# Patient Record
Sex: Male | Born: 1965 | Race: White | Hispanic: No | Marital: Married
Health system: Southern US, Community
[De-identification: ages and names within clinical notes are randomized; demographics above are authoritative.]

---

## 2018-03-02 ENCOUNTER — Emergency Department (HOSPITAL_COMMUNITY): Payer: Managed Care, Other (non HMO)

## 2018-03-02 ENCOUNTER — Other Ambulatory Visit: Payer: Self-pay

## 2018-03-02 ENCOUNTER — Encounter (HOSPITAL_COMMUNITY): Payer: Self-pay

## 2018-03-02 ENCOUNTER — Emergency Department (HOSPITAL_COMMUNITY)
Admission: EM | Admit: 2018-03-02 | Discharge: 2018-03-02 | Disposition: A | Discharge status: Home / Self Care | Payer: Managed Care, Other (non HMO) | Attending: Emergency Medicine | Admitting: Emergency Medicine

## 2018-03-02 DIAGNOSIS — R1031 Right lower quadrant pain: Secondary | ICD-10-CM | POA: Insufficient documentation

## 2018-03-02 DIAGNOSIS — R319 Hematuria, unspecified: Secondary | ICD-10-CM

## 2018-03-02 DIAGNOSIS — R109 Unspecified abdominal pain: Secondary | ICD-10-CM

## 2018-03-02 LAB — CBC WITH DIFFERENTIAL/PLATELET
Abs Immature Granulocytes: 0.02 10*3/uL (ref 0.00–0.07)
Basophils Absolute: 0 10*3/uL (ref 0.0–0.1)
Basophils Relative: 1 %
Eosinophils Absolute: 0.2 10*3/uL (ref 0.0–0.5)
Eosinophils Relative: 4 %
HEMATOCRIT: 45.9 % (ref 39.0–52.0)
HEMOGLOBIN: 15.7 g/dL (ref 13.0–17.0)
Immature Granulocytes: 0 %
LYMPHS ABS: 1.6 10*3/uL (ref 0.7–4.0)
Lymphocytes Relative: 33 %
MCH: 30.1 pg (ref 26.0–34.0)
MCHC: 34.2 g/dL (ref 30.0–36.0)
MCV: 88.1 fL (ref 80.0–100.0)
Monocytes Absolute: 0.3 10*3/uL (ref 0.1–1.0)
Monocytes Relative: 6 %
Neutro Abs: 2.8 10*3/uL (ref 1.7–7.7)
Neutrophils Relative %: 56 %
Platelets: 226 10*3/uL (ref 150–400)
RBC: 5.21 MIL/uL (ref 4.22–5.81)
RDW: 12 % (ref 11.5–15.5)
WBC: 5 10*3/uL (ref 4.0–10.5)
nRBC: 0 % (ref 0.0–0.2)

## 2018-03-02 LAB — BASIC METABOLIC PANEL
Anion gap: 7 (ref 5–15)
BUN: 14 mg/dL (ref 6–20)
CO2: 24 mmol/L (ref 22–32)
Calcium: 8.7 mg/dL — ABNORMAL LOW (ref 8.9–10.3)
Chloride: 108 mmol/L (ref 98–111)
Creatinine, Ser: 0.84 mg/dL (ref 0.61–1.24)
GFR calc Af Amer: >60 mL/min (ref >60)
GFR calc non Af Amer: >60 mL/min (ref >60)
Glucose, Bld: 143 mg/dL — ABNORMAL HIGH (ref 70–99)
Potassium: 3.5 mmol/L (ref 3.5–5.1)
Sodium: 139 mmol/L (ref 135–145)

## 2018-03-02 LAB — URINALYSIS, MICROSCOPIC (REFLEX): RBC / HPF: >50 RBC/hpf (ref 0–5)

## 2018-03-02 LAB — URINALYSIS, ROUTINE W REFLEX MICROSCOPIC

## 2018-03-02 NOTE — ED Notes (Signed)
Urine culture sent to the lab. 

## 2018-03-02 NOTE — ED Notes (Signed)
Pt states that pain has significantly decreased since urination, but he feels that he was unable to urinate everything out.

## 2018-03-02 NOTE — ED Triage Notes (Signed)
Pt reports R sided flank pain and hematuria starting around 1015 last night. A&Ox4. Ambulatory. No hx of kidney stones.

## 2018-03-02 NOTE — Discharge Instructions (Signed)
Continue good hydration to keep kidneys flushed. Follow-up with urology as needed-- call for appt. Return to the ED for new or worsening symptoms.

## 2018-03-02 NOTE — ED Provider Notes (Signed)
[facility]-EMERGENCY DEPT Provider Note   CSN: [id number] Arrival date & [id number]: 03/02/18  0148     History   Chief Complaint Chief Complaint  Patient presents with  . Flank Pain    HPI Tyrone Jacobs is a 53 y.o. male.  The history is provided by the patient and medical records.  Flank Pain      53 year old male here with right flank pain.  States that started around 1015 last evening.  Pain is sharp and localized to right back without radiation.  No nausea, vomiting, or diarrhea.  Did report episode of hematuria which is what prompted him to come in.  He has no history of kidney stones.  No difficulty urinating.  No recent fever or chills.  Patient's pain initially 7/10, after urination in triage to provide urine sample pain decreased to 1/10.  No meds taken PTA.  History reviewed. No pertinent past medical history.  There are no active problems to display for this patient.      Home Medications    Prior to Admission medications   Not on File    Family History History reviewed. No pertinent family history.  Social History Social History   Tobacco Use  . Smoking status: Not on file  Substance Use Topics  . Alcohol use: Not on file  . Drug use: Not on file     Allergies   Patient has no known allergies.   Review of Systems Review of Systems  Genitourinary: Positive for flank pain and hematuria.  All other systems reviewed and are negative.    Physical Exam Updated Vital Signs BP 137/87   Pulse 80   Temp 97.9 F (36.6 C) (Oral)   Resp 16   Ht 6\' 1"  (1.854 m)   Wt 93 kg   SpO2 93%   BMI 27.05 kg/m   Physical Exam Vitals signs and nursing note reviewed.  Constitutional:      Appearance: He is well-developed.  HENT:     Head: Normocephalic and atraumatic.  Eyes:     Conjunctiva/sclera: Conjunctivae normal.     Pupils: Pupils are equal, round, and reactive to light.  Neck:     Musculoskeletal: Normal range of  motion.  Cardiovascular:     Rate and Rhythm: Normal rate and regular rhythm.     Heart sounds: Normal heart sounds.  Pulmonary:     Effort: Pulmonary effort is normal.     Breath sounds: Normal breath sounds.  Abdominal:     General: Bowel sounds are normal.     Palpations: Abdomen is soft.  Musculoskeletal: Normal range of motion.  Skin:    General: Skin is warm and dry.  Neurological:     Mental Status: He is alert and oriented to person, place, and time.      ED Treatments / Results  Labs (all labs ordered are listed, but only abnormal results are displayed) Labs Reviewed  URINALYSIS, ROUTINE W REFLEX MICROSCOPIC - Abnormal; Notable for the following components:      Result Value   Color, Urine RED (*)    APPearance TURBID (*)    Glucose, UA   (*)    Value: TEST NOT REPORTED DUE TO COLOR INTERFERENCE OF URINE PIGMENT   Hgb urine dipstick   (*)    Value: TEST NOT REPORTED DUE TO COLOR INTERFERENCE OF URINE PIGMENT   Bilirubin Urine   (*)    Value: TEST NOT REPORTED DUE TO COLOR INTERFERENCE  OF URINE PIGMENT   Ketones, ur   (*)    Value: TEST NOT REPORTED DUE TO COLOR INTERFERENCE OF URINE PIGMENT   Protein, ur   (*)    Value: TEST NOT REPORTED DUE TO COLOR INTERFERENCE OF URINE PIGMENT   Nitrite   (*)    Value: TEST NOT REPORTED DUE TO COLOR INTERFERENCE OF URINE PIGMENT   Leukocytes, UA   (*)    Value: TEST NOT REPORTED DUE TO COLOR INTERFERENCE OF URINE PIGMENT   All other components within normal limits  BASIC METABOLIC PANEL - Abnormal; Notable for the following components:   Glucose, Bld 143 (*)    Calcium 8.7 (*)    All other components within normal limits  URINALYSIS, MICROSCOPIC (REFLEX) - Abnormal; Notable for the following components:   Bacteria, UA FEW (*)    All other components within normal limits  CBC WITH DIFFERENTIAL/PLATELET    EKG None  Radiology Ct Renal Stone Study  Result Date: 03/02/2018 CLINICAL DATA:  Right flank pain, gross  hematuria EXAM: CT ABDOMEN AND PELVIS WITHOUT CONTRAST TECHNIQUE: Multidetector CT imaging of the abdomen and pelvis was performed following the standard protocol without IV contrast. COMPARISON:  None. FINDINGS: Lower chest: No acute abnormality. Hepatobiliary: No focal liver abnormality is seen. No gallstones, gallbladder wall thickening, or biliary dilatation. Pancreas: Unremarkable. No pancreatic ductal dilatation or surrounding inflammatory changes. Spleen: Normal in size without focal abnormality. Adrenals/Urinary Tract: Adrenal glands are unremarkable. Kidneys are normal, without renal calculi, focal lesion, or hydronephrosis. Small amount of hyperdense material along the dependent aspect of the bladder likely reflecting hemorrhage. Stomach/Bowel: Stomach is within normal limits. No evidence of bowel wall thickening, distention, or inflammatory changes. Large amount of stool in the ascending colon. No pneumatosis, pneumoperitoneum or portal venous gas. Vascular/Lymphatic: No significant vascular findings are present. No enlarged abdominal or pelvic lymph nodes. Reproductive: Prostate is unremarkable. Other: Bilateral inguinal hernia repair. No abdominal wall hernia. No ascites. Musculoskeletal: No acute osseous abnormality. No aggressive osseous lesion. IMPRESSION: 1. No urolithiasis or obstructive uropathy. Small amount of hemorrhagic fluid in the bladder. Electronically Signed   By: Elige KoHetal  Patel   On: 03/02/2018 03:37    Procedures Procedures (including critical care time)  Medications Ordered in ED Medications - No data to display   Initial Impression / Assessment and Plan / ED Course  I have reviewed the triage vital signs and the nursing notes.  Pertinent labs & imaging results that were available during my care of the patient were reviewed by me and considered in my medical decision making (see chart for details).  53 y.o. M here with right flank pain and hematuria since last evening.  No  hx of kidney stones.  He urinated in triage and had significant improvement of his pain.  He did pass some clots in his urine.  He is afebrile and nontoxic.  No focal CVA tenderness on my exam.  Abdomen is soft and benign.  UA with large amount of blood, remainder of test unable to be performed.  Screening labs are reassuring.  CT renal study without stone or obstructive uropathy, but does have small amount of hemorrhagic fluid in the bladder.  Suspect that patient may have had a kidney stone that passed upon arrival here as he has been pain-free since that time.  He has no other stones noted on his CT imaging.  Encouraged to increase water intake to help flush out the urinary system.  Given urology follow-up if  hematuria not resolving over the next 24 to 48 hours.  He can return here for any new or worsening symptoms.  Final Clinical Impressions(s) / ED Diagnoses   Final diagnoses:  Flank pain  Hematuria, unspecified type    ED Discharge Orders    None       Garlon Hatchet, PA-C 03/02/18 4503    Shaune Pollack, MD 03/03/18 1630

## 2020-02-18 IMAGING — CT CT RENAL STONE PROTOCOL
2 of 4 series · 16 of 46 positions shown, 18 images · non-contrast
Comparison: None.

CLINICAL DATA: Right flank pain, gross hematuria

EXAM:
CT ABDOMEN AND PELVIS WITHOUT CONTRAST
TECHNIQUE: Multidetector CT imaging of the abdomen and pelvis was performed
following the standard protocol without IV contrast.

[Series 2: axial st · axial · 0.73mm/px · z∈[-459,-34]mm · 13 of 97 slices shown, 15 images]
[im 6/97  soft-tissue]
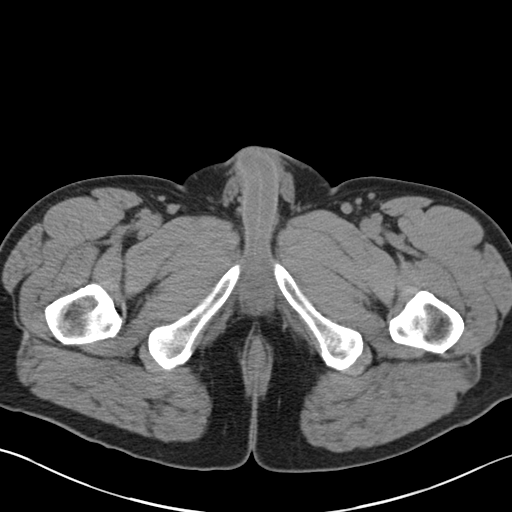
[im 6/97  bone]
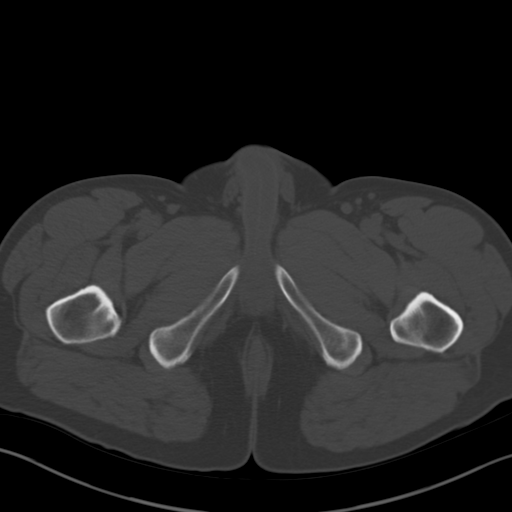
[im 16/97  soft-tissue]
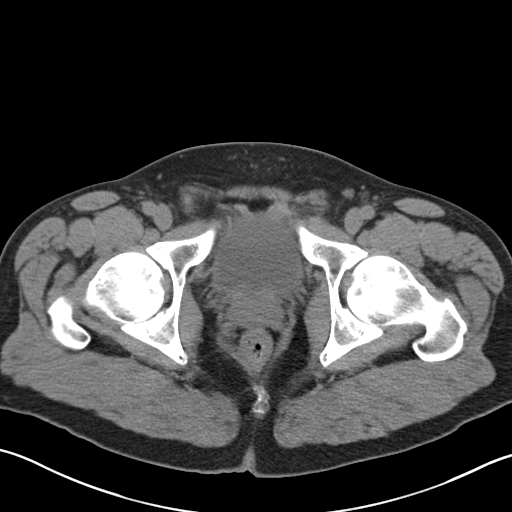
[im 21/97  soft-tissue]
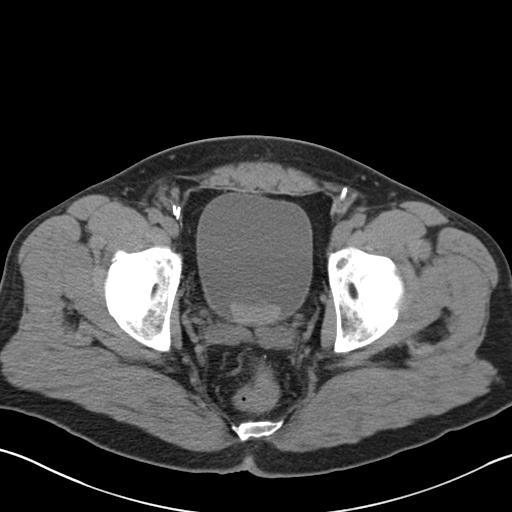
[im 26/97  soft-tissue]
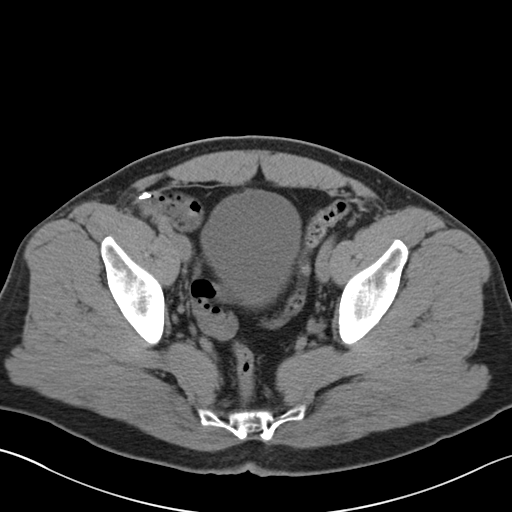
[im 36/97  soft-tissue]
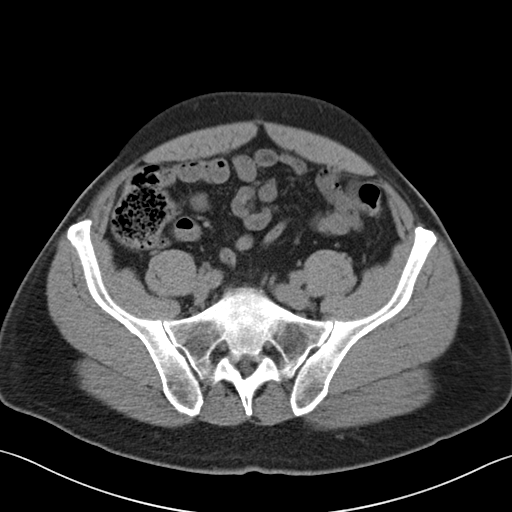
[im 41/97  soft-tissue]
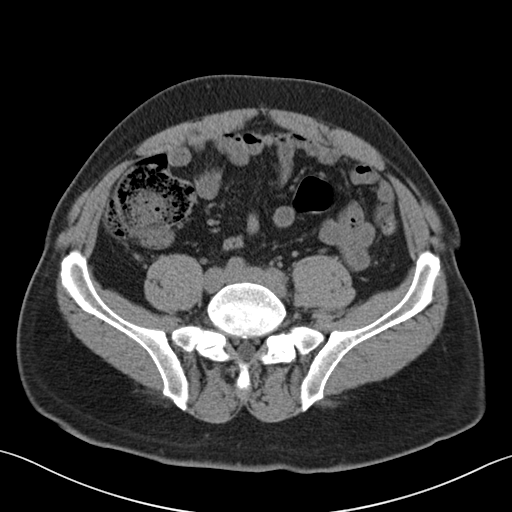
[im 51/97  soft-tissue]
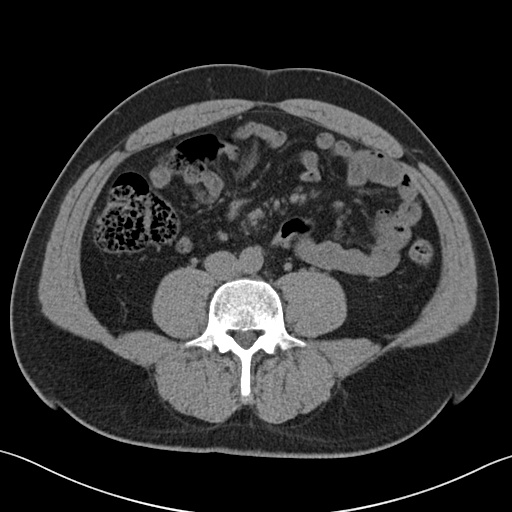
[im 56/97  soft-tissue]
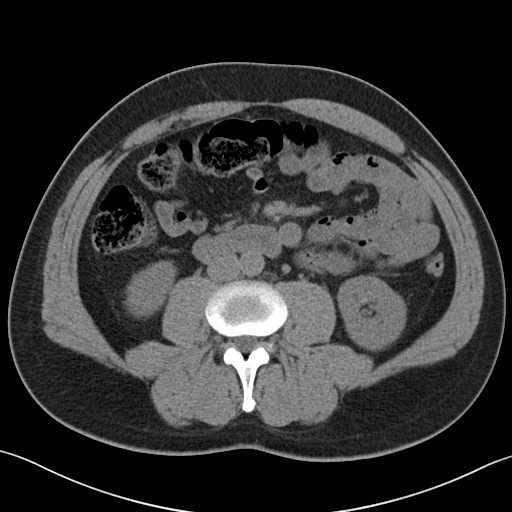
[im 61/97  soft-tissue]
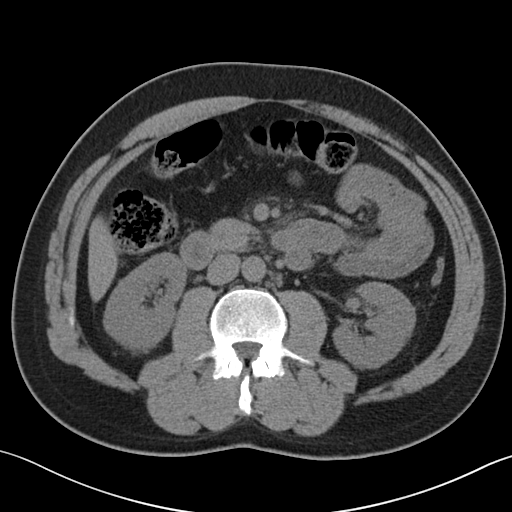
[im 61/97  bone]
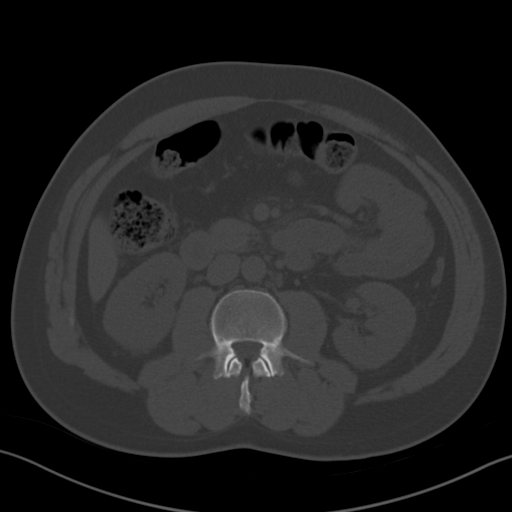
[im 71/97  soft-tissue]
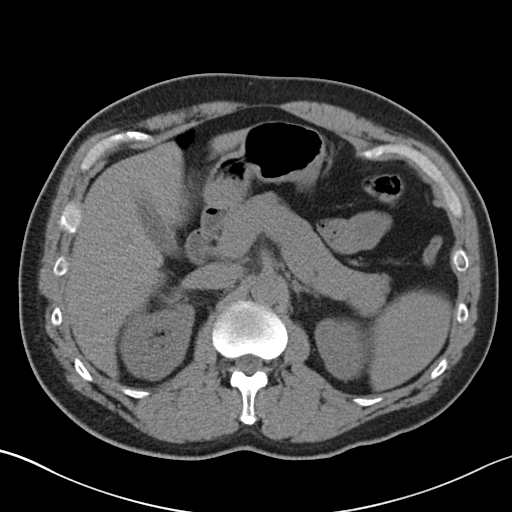
[im 76/97  soft-tissue]
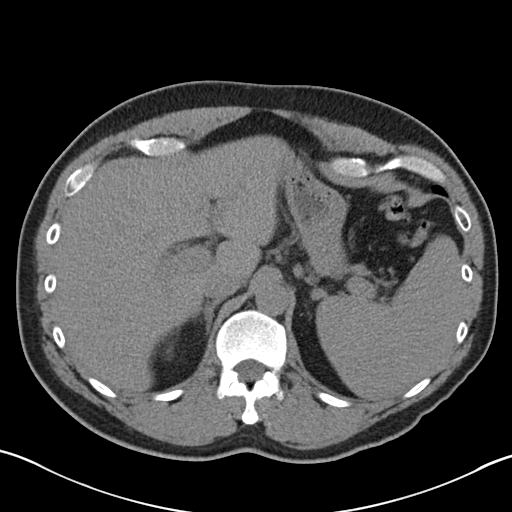
[im 81/97  soft-tissue]
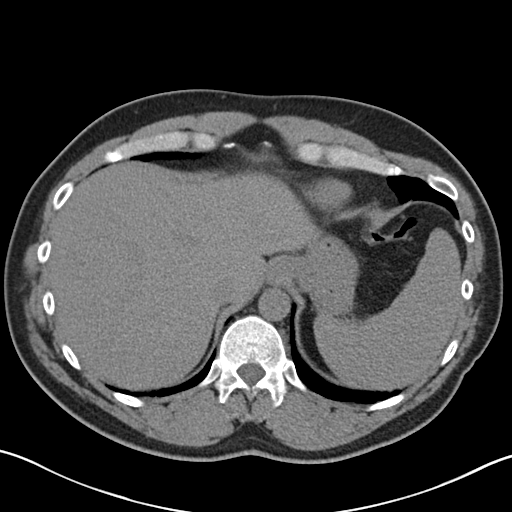
[im 91/97  soft-tissue]
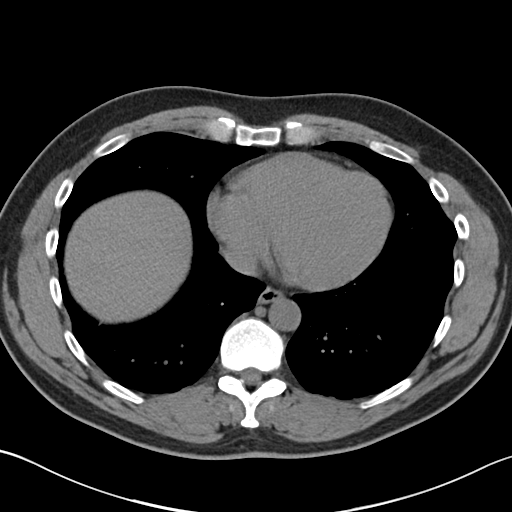

[Series 4: coronal · coronal · 0.67mm/px · 3 of 143 slices shown]
[im 48/143  soft-tissue]
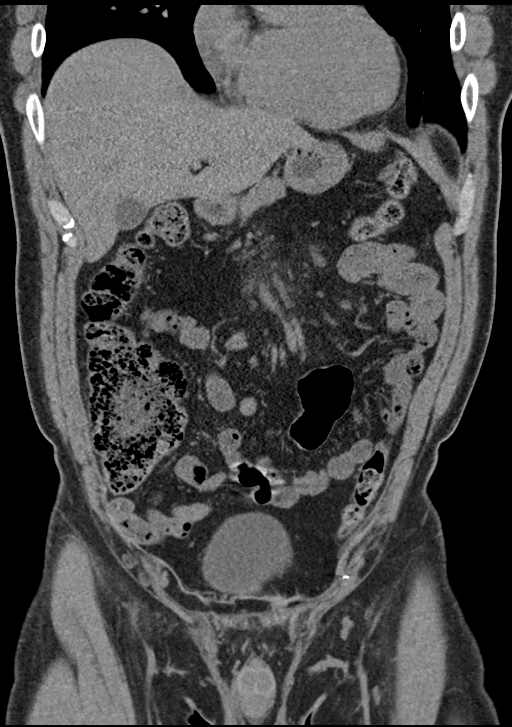
[im 64/143  soft-tissue]
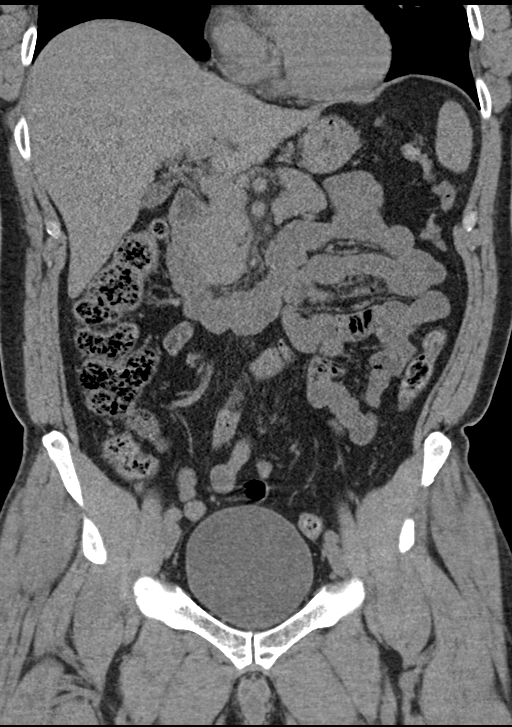
[im 79/143  soft-tissue]
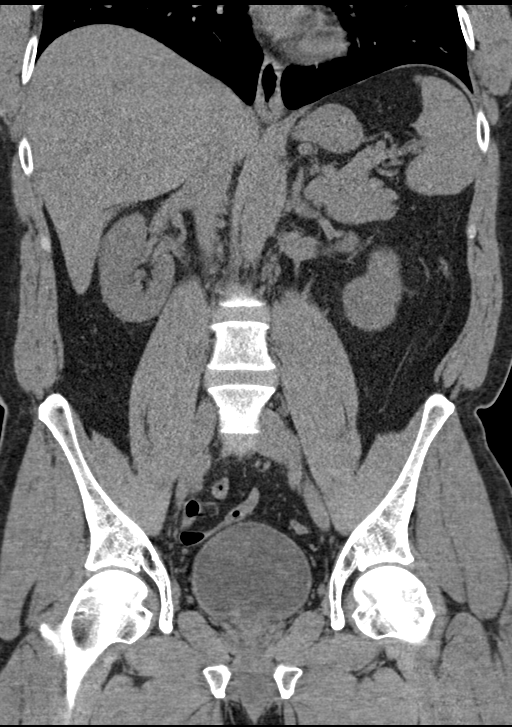

[16 of 46 positions shown; findings below may reference images not displayed]

FINDINGS: Lower chest: No acute abnormality.

Hepatobiliary: No focal liver abnormality is seen. No gallstones,
gallbladder wall thickening, or biliary dilatation.

Pancreas: Unremarkable. No pancreatic ductal dilatation or
surrounding inflammatory changes.

Spleen: Normal in size without focal abnormality.

Adrenals/Urinary Tract: Adrenal glands are unremarkable. Kidneys are
normal, without renal calculi, focal lesion, or hydronephrosis.
Small amount of hyperdense material along the dependent aspect of
the bladder likely reflecting hemorrhage..

Stomach/Bowel: Stomach is within normal limits. No evidence of bowel
wall thickening, distention, or inflammatory changes. Large amount
of stool in the ascending colon. No pneumatosis, pneumoperitoneum or
portal venous gas.

Vascular/Lymphatic: No significant vascular findings are present. No
enlarged abdominal or pelvic lymph nodes.

Reproductive: Prostate is unremarkable.

Other: Bilateral inguinal hernia repair. No abdominal wall hernia.
No ascites.

Musculoskeletal: No acute osseous abnormality. No aggressive osseous
lesion.
IMPRESSION: 1. No urolithiasis or obstructive uropathy. Small amount of
hemorrhagic fluid in the bladder.
